# Patient Record
Sex: Female | Born: 1990 | Race: White | Hispanic: No | Smoking: Current every day smoker
Health system: Southern US, Community
[De-identification: ages and names within clinical notes are randomized; demographics above are authoritative.]

## PROBLEM LIST (undated history)

## (undated) DIAGNOSIS — M25561 Pain in right knee: Secondary | ICD-10-CM

## (undated) DIAGNOSIS — M79642 Pain in left hand: Secondary | ICD-10-CM

## (undated) DIAGNOSIS — E669 Obesity, unspecified: Secondary | ICD-10-CM

## (undated) DIAGNOSIS — M79641 Pain in right hand: Secondary | ICD-10-CM

## (undated) DIAGNOSIS — F319 Bipolar disorder, unspecified: Secondary | ICD-10-CM

## (undated) DIAGNOSIS — M130 Polyarthritis, unspecified: Secondary | ICD-10-CM

## (undated) DIAGNOSIS — M25511 Pain in right shoulder: Secondary | ICD-10-CM

## (undated) DIAGNOSIS — M545 Low back pain, unspecified: Secondary | ICD-10-CM

## (undated) DIAGNOSIS — F909 Attention-deficit hyperactivity disorder, unspecified type: Secondary | ICD-10-CM

## (undated) DIAGNOSIS — E785 Hyperlipidemia, unspecified: Secondary | ICD-10-CM

## (undated) DIAGNOSIS — D649 Anemia, unspecified: Secondary | ICD-10-CM

## (undated) DIAGNOSIS — M25562 Pain in left knee: Secondary | ICD-10-CM

## (undated) DIAGNOSIS — Q796 Ehlers-Danlos syndrome, unspecified: Secondary | ICD-10-CM

## (undated) DIAGNOSIS — R7989 Other specified abnormal findings of blood chemistry: Secondary | ICD-10-CM

## (undated) DIAGNOSIS — R5383 Other fatigue: Secondary | ICD-10-CM

## (undated) HISTORY — DX: Other specified abnormal findings of blood chemistry: R79.89

## (undated) HISTORY — DX: Low back pain: M54.5

## (undated) HISTORY — DX: Anemia, unspecified: D64.9

## (undated) HISTORY — DX: Other fatigue: R53.83

## (undated) HISTORY — DX: Pain in right hand: M79.641

## (undated) HISTORY — DX: Low back pain, unspecified: M54.50

## (undated) HISTORY — DX: Attention-deficit hyperactivity disorder, unspecified type: F90.9

## (undated) HISTORY — DX: Pain in left knee: M25.562

## (undated) HISTORY — DX: Pain in right shoulder: M25.511

## (undated) HISTORY — DX: Hyperlipidemia, unspecified: E78.5

## (undated) HISTORY — DX: Ehlers-Danlos syndrome, unspecified: Q79.60

## (undated) HISTORY — DX: Pain in left hand: M79.642

## (undated) HISTORY — DX: Obesity, unspecified: E66.9

## (undated) HISTORY — DX: Pain in right knee: M25.561

## (undated) HISTORY — DX: Polyarthritis, unspecified: M13.0

## (undated) HISTORY — DX: Bipolar disorder, unspecified: F31.9

## (undated) HISTORY — PX: CHOLECYSTECTOMY: SHX55

---

## 2017-01-10 DIAGNOSIS — R072 Precordial pain: Secondary | ICD-10-CM | POA: Insufficient documentation

## 2017-01-11 ENCOUNTER — Emergency Department (HOSPITAL_COMMUNITY): Payer: BLUE CROSS/BLUE SHIELD

## 2017-01-11 ENCOUNTER — Encounter (HOSPITAL_COMMUNITY): Payer: Self-pay | Admitting: Emergency Medicine

## 2017-01-11 ENCOUNTER — Emergency Department (HOSPITAL_COMMUNITY)
Admission: EM | Admit: 2017-01-11 | Discharge: 2017-01-11 | Disposition: A | Discharge status: Other Acute Inpatient Hospital | Payer: BLUE CROSS/BLUE SHIELD | Attending: Emergency Medicine | Admitting: Emergency Medicine

## 2017-01-11 DIAGNOSIS — R072 Precordial pain: Secondary | ICD-10-CM

## 2017-01-11 LAB — I-STAT TROPONIN, ED: Troponin i, poc: 0 ng/mL (ref 0.00–0.08)

## 2017-01-11 LAB — BASIC METABOLIC PANEL
Anion gap: 7 (ref 5–15)
BUN: 15 mg/dL (ref 6–20)
CO2: 25 mmol/L (ref 22–32)
CREATININE: 0.73 mg/dL (ref 0.44–1.00)
Calcium: 8.6 mg/dL — ABNORMAL LOW (ref 8.9–10.3)
Chloride: 104 mmol/L (ref 101–111)
GFR calc Af Amer: >60 mL/min (ref >60)
GLUCOSE: 94 mg/dL (ref 65–99)
Potassium: 4.1 mmol/L (ref 3.5–5.1)
Sodium: 136 mmol/L (ref 135–145)

## 2017-01-11 LAB — CBC
HEMATOCRIT: 33.6 % — AB (ref 36.0–46.0)
HEMOGLOBIN: 10.6 g/dL — AB (ref 12.0–15.0)
MCH: 28.4 pg (ref 26.0–34.0)
MCHC: 31.5 g/dL (ref 30.0–36.0)
MCV: 90.1 fL (ref 78.0–100.0)
PLATELETS: 343 10*3/uL (ref 150–400)
RBC: 3.73 MIL/uL — AB (ref 3.87–5.11)
RDW: 14.5 % (ref 11.5–15.5)
WBC: 9.3 10*3/uL (ref 4.0–10.5)

## 2017-01-11 MED ORDER — HYDROCODONE-ACETAMINOPHEN 5-325 MG PO TABS
1.0000 | ORAL_TABLET | Freq: Once | ORAL | Status: AC
  Administered 2017-01-11: 1 via ORAL
  Filled 2017-01-11: qty 1

## 2017-01-11 MED ORDER — TRAMADOL HCL 50 MG PO TABS
50.0000 mg | ORAL_TABLET | Freq: Once | ORAL | Status: AC
  Administered 2017-01-11: 50 mg via ORAL
  Filled 2017-01-11: qty 1

## 2017-01-11 MED ORDER — IOPAMIDOL (ISOVUE-370) INJECTION 76%
INTRAVENOUS | Status: AC
  Administered 2017-01-11: 100 mL
  Filled 2017-01-11: qty 100

## 2017-01-11 MED ORDER — MORPHINE SULFATE (PF) 4 MG/ML IV SOLN
4.0000 mg | Freq: Once | INTRAVENOUS | Status: AC
  Administered 2017-01-11: 4 mg via INTRAVENOUS

## 2017-01-11 MED ORDER — MORPHINE SULFATE (PF) 4 MG/ML IV SOLN
4.0000 mg | Freq: Once | INTRAVENOUS | Status: DC
  Filled 2017-01-11: qty 1

## 2017-01-11 NOTE — ED Notes (Signed)
[facility] called for transport. 

## 2017-01-11 NOTE — ED Notes (Signed)
Pt returned from CT °

## 2017-01-11 NOTE — ED Triage Notes (Signed)
Reports having a vaginal delivery on Tuesday 27th.  Bleeding stopped today then started having heavy vaginal bleeding this evening with lot of clots.  Also reports left sided chest pain that started this morning that radiates into back.  Taking percocet 5 and Ibuprofen 800mg .  Last dose of percocet at 9pm.  No change in chest pain with meds.

## 2017-01-11 NOTE — ED Notes (Signed)
Pt reports she is bleeding and is passing a large amount of clots. Pt reports giving birth on 3/27. Pt reports passing 2 large golf ball sized clots. Pt reports the chest pain comes about when she breathes and started approximately at 0500 on 01/10/17. Pt reports this is just happening on the left side. Pt reports giving birth at [facility] and went back on early Saturday morning, 01/09/17.

## 2017-01-11 NOTE — ED Notes (Signed)
Called the OB Doc on call for Dr. Bethann Goo w/ [facility], and no answer.

## 2017-01-11 NOTE — ED Notes (Signed)
Patient transported to CT 

## 2017-01-11 NOTE — ED Provider Notes (Signed)
Emergency Department Provider Note   I have reviewed the triage vital signs and the nursing notes.   HISTORY  Chief Complaint Chest Pain   HPI Margaret Ali is a 26 y.o. female with history of prior cholecystectomy who presents to the ED for evaluation of vaginal bleeding and chest pain. This patient is 6 days postpartum following an uncomplicated vaginal delivery. Since that time she has continued to experience bleeding; today passing several large clots. She was seen at an outside facility in [facility] 2 days ago for L leg swelling; she had a negative ultrasound and was later discharged. Approximately 18 hours ago, she woke with chest pain under the L breast which becomes severe and radiates into the L upper back when "breathing deeply". She is continuing to report suprapubic, pelvic pain. Her leg swelling has improved. No dyspnea.    History reviewed. No pertinent past medical history.  There are no active problems to display for this patient.   Past Surgical History:  Procedure Laterality Date  . CHOLECYSTECTOMY      Current Outpatient Rx  . Order #: [id number] Class: Historical Med  . Order #: [id number] Class: Historical Med    Allergies Omnicef [cefdinir]; Augmentin [amoxicillin-pot clavulanate]; and Ciprofloxacin hcl  No family history on file.  Social History Social History  Substance Use Topics  . Smoking status: Never Smoker  . Smokeless tobacco: Never Used  . Alcohol use No    Review of Systems Constitutional: No fever/chills Eyes: No visual changes. ENT: No sore throat. Cardiovascular: + chest pain, L leg swelling per HPI Respiratory: Denies shortness of breath. Gastrointestinal: + lower abdominal/pelvic pain.  No nausea, no vomiting.  No diarrhea.  No constipation. Genitourinary: Negative for dysuria. + vaginal bleeding Musculoskeletal: Negative for back pain. Skin: Negative for rash. Neurological: Negative for headaches, focal weakness or  numbness.  10-point ROS otherwise negative.  ____________________________________________   PHYSICAL EXAM:  VITAL SIGNS: ED Triage Vitals  Enc Vitals Group     BP 01/10/17 2348 (!) 147/83     Pulse Rate 01/10/17 2348 72     Resp 01/10/17 2348 14     Temp 01/10/17 2348 98.7 F (37.1 C)     Temp Source 01/10/17 2348 Oral     SpO2 01/10/17 2348 100 %     Weight 01/10/17 2348 200 lb (90.7 kg)     Height 01/10/17 2348 5\' 4"  (1.626 m)     Pain Score 01/11/17 0004 6   Constitutional: Alert and oriented. Well appearing and in no acute distress. Eyes: Conjunctivae are normal. Head: Atraumatic. Nose: No congestion/rhinnorhea. Mouth/Throat: Mucous membranes are moist.   Neck: No stridor.   Cardiovascular: Normal rate, regular rhythm. Good peripheral circulation. Grossly normal heart sounds.   Respiratory: Normal respiratory effort.  No retractions. Lungs CTAB. Gastrointestinal: Soft and nontender. No distention.  Genitourinary: Moderate vaginal bleeding with clots apparent. No large vaginal laceration or site of active bleeding identified.  Musculoskeletal: No lower extremity tenderness nor edema. No gross deformities of extremities. Neurologic:  Normal speech and language. No gross focal neurologic deficits are appreciated.  Skin:  Skin is warm, dry and intact. No rash noted. Psychiatric: Mood and affect are normal. Speech and behavior are normal.  ____________________________________________   LABS (all labs ordered are listed, but only abnormal results are displayed)  Labs Reviewed  BASIC METABOLIC PANEL - Abnormal; Notable for the following:       Result Value   Calcium 8.6 (*)    All  other components within normal limits  CBC - Abnormal; Notable for the following:    RBC 3.73 (*)    Hemoglobin 10.6 (*)    HCT 33.6 (*)    All other components within normal limits  I-STAT TROPOININ, ED   ____________________________________________  EKG   EKG  Interpretation  Date/Time:  Monday January 11 2017 00:01:42 EDT Ventricular Rate:  77 PR Interval:  136 QRS Duration: 78 QT Interval:  356 QTC Calculation: 402 R Axis:   87 Text Interpretation:  Normal sinus rhythm with sinus arrhythmia Septal infarct , age undetermined Abnormal ECG No STEMI. No old tracing for comparison.  Confirmed by Ernesto Lashway MD, Favian Kittleson [phone]) on 01/11/2017 2:46:57 PM       ____________________________________________  RADIOLOGY  Dg Chest 2 View  Result Date: 01/11/2017 CLINICAL DATA:  Left-sided chest pain radiating to the back. Leg swelling last week. Recent pregnancy. EXAM: CHEST  2 VIEW COMPARISON:  None. FINDINGS: The lungs are clear. The pulmonary vasculature is normal. Heart size is normal. Hilar and mediastinal contours are unremarkable. There is no pleural effusion. IMPRESSION: No active cardiopulmonary disease. Electronically Signed   By: Andreas Newport M.D.   On: 01/11/2017 00:34   Ct Angio Chest Pe W And/or Wo Contrast  Result Date: 01/11/2017 CLINICAL DATA:  Heavy vaginal bleeding. Left-sided chest pain, onset this morning. EXAM: CT ANGIOGRAPHY CHEST WITH CONTRAST TECHNIQUE: Multidetector CT imaging of the chest was performed using the standard protocol during bolus administration of intravenous contrast. Multiplanar CT image reconstructions and MIPs were obtained to evaluate the vascular anatomy. CONTRAST:  100 mL Isovue 370 COMPARISON:  Radiographs 01/11/2017 FINDINGS: Cardiovascular: Satisfactory opacification of the pulmonary arteries to the segmental level. No evidence of pulmonary embolism. Normal heart size. No pericardial effusion. Mediastinum/Nodes: No enlarged mediastinal, hilar, or axillary lymph nodes. Thyroid gland, trachea, and esophagus demonstrate no significant findings. Lungs/Pleura: Mild lung base opacities, left greater than right, likely atelectatic. No confluent airspace consolidation. Upper Abdomen: No acute abnormality. Musculoskeletal: No  chest wall abnormality. No acute or significant osseous findings. Review of the MIP images confirms the above findings. IMPRESSION: Negative for acute pulmonary embolism. Mild atelectatic appearing lung base opacities. No consolidation or effusion. Electronically Signed   By: Andreas Newport M.D.   On: 01/11/2017 02:02   US Pelvis Complete  Result Date: 01/11/2017 CLINICAL DATA:  Six days postpartum vaginal delivery. Passing clots today. Pelvic pain. EXAM: TRANSABDOMINAL ULTRASOUND OF PELVIS TECHNIQUE: Transabdominal ultrasound examination of the pelvis was performed including evaluation of the uterus, ovaries, adnexal regions, and pelvic cul-de-sac. COMPARISON:  None. FINDINGS: Uterus Measurements: 18.3 x 9.9 cm. No fibroids or other mass visualized. Endometrium Thickness: Irregular in thickness and echotexture. Solid-appearing thickening, as well as blood or fluid in the canal. 4.9 cm thickness. Right ovary Measurements: 3.4 x 3.4 x 3.6 cm. Normal appearance/no adnexal mass. Left ovary Measurements: 4.2 x 3.5 x 2.8 cm. Normal appearance/no adnexal mass. Other findings:  No abnormal free fluid. IMPRESSION: 4.9 cm soft tissue thickening in the endometrium, as well as blood or fluid in the canal. Suspicious for retained products of conception. Electronically Signed   By: Andreas Newport M.D.   On: 01/11/2017 04:53    ____________________________________________   PROCEDURES  Procedure(s) performed:   Procedures  None ____________________________________________   INITIAL IMPRESSION / ASSESSMENT AND PLAN / ED COURSE  Pertinent labs & imaging results that were available during my care of the patient were reviewed by me and considered in my medical decision making (  see chart for details).  Patient resents emergency pertinent for evaluation of heavy vaginal bleeding in the setting of recent vaginal delivery. No complications during delivery. Patient is also complaining of some left-sided  pleuritic chest pain. Had recent unilateral leg swelling that resolved. Vital signs are otherwise unremarkable. With concerning story for pulmonary embolism and recent risk factor of pregnancy plan for CT scan of the chest. Hemoglobin is normal. Patient describing some heavy vaginal bleeding and clots. Plan for exam and close OB/GYN follow-up.  02:05 AM Patient with worsening chest pain after return from CT. CT with no PE but some atelectasis changes. Plan to treat pain and reassess.   04:11 AM Patient's CP slightly improved. Pelvic with no significant vaginal trauma or laceration to explain increased bleeding. Will obtain TVUS to evaluate for any retained placenta.   Spoke with Dr. Bethann Goo who requests that the patient be transferred for evaluation and likely D&C.   06:35 AM Spoke with ED attending Dr. Louie Bun who accepts the patient in ED-ED transfer for evaluation with Dr. Bethann Goo on arrival. He is the OB on call today and the patient's OB.  ____________________________________________  FINAL CLINICAL IMPRESSION(S) / ED DIAGNOSES  Final diagnoses:  Retained products of conception with hemorrhage  Precordial chest pain     MEDICATIONS GIVEN DURING THIS VISIT:  Medications  iopamidol (ISOVUE-370) 76 % injection (100 mLs  Contrast Given 01/11/17 0140)  morphine 4 MG/ML injection 4 mg (4 mg Intravenous Given 01/11/17 0244)  HYDROcodone-acetaminophen (NORCO/VICODIN) 5-325 MG per tablet 1 tablet (1 tablet Oral Given 01/11/17 0620)  traMADol (ULTRAM) tablet 50 mg (50 mg Oral Given 01/11/17 0620)     NEW OUTPATIENT MEDICATIONS STARTED DURING THIS VISIT:  None   Note:  This document was prepared using Dragon voice recognition software and may include unintentional dictation errors.  Nanda Quinton, MD Emergency Medicine   Margette Fast, MD 01/11/17 [phone]

## 2017-10-22 DIAGNOSIS — D473 Essential (hemorrhagic) thrombocythemia: Secondary | ICD-10-CM | POA: Diagnosis not present

## 2018-01-11 ENCOUNTER — Encounter: Payer: Self-pay | Admitting: Vascular Surgery

## 2018-01-11 ENCOUNTER — Ambulatory Visit (INDEPENDENT_AMBULATORY_CARE_PROVIDER_SITE_OTHER): Payer: BLUE CROSS/BLUE SHIELD | Admitting: Vascular Surgery

## 2018-01-11 VITALS — BP 130/82 | HR 96 | Resp 18 | Ht 64.0 in | Wt 170.4 lb

## 2018-01-11 DIAGNOSIS — I8393 Asymptomatic varicose veins of bilateral lower extremities: Secondary | ICD-10-CM | POA: Diagnosis not present

## 2018-01-11 NOTE — Progress Notes (Signed)
Subjective:     Patient ID: Margaret Ali, female   DOB: [birth year], 27 y.o.   MRN: [id number]  HPI This 27 year old female was referred for possible venous insufficiency of both lower extremities.  The patient recently was having an ultrasound performed of her shoulder and mentioned to the technologist that she had some "varicose veins" so ultrasound exam of 1 of the lower extremities was performed which was interpreted as venous insufficiency.  The patient was told that she had venous insufficiency and needed treatment.  She was referred to this office for further evaluation.  She denies any chronic swelling.  She does have hypersensitivity and heaviness in both lower extremities with which she attributes to chasing a 84-year-old child around.  She has no history of DVT thrombophlebitis stasis ulcers or bleeding.  She does not wear elastic compression stockings.  Past Medical History:  Diagnosis Date  . ADHD   . Anemia   . Bipolar depression ([place])   . Ehlers-Danlos syndrome   . Elevated platelet count   . Fatigue   . Hyperlipidemia    Mixed  . Low back pain   . Obesity   . Pain in left hand   . Pain in left knee   . Pain in right hand   . Pain in right knee   . Pain in right shoulder   . Polyarthropathy     Social History   Tobacco Use  . Smoking status: Current Every Day Smoker    Types: Cigarettes  . Smokeless tobacco: Never Used  Substance Use Topics  . Alcohol use: No    Family History  Problem Relation Age of Onset  . Hypertension Mother   . Mental illness Mother   . Mental illness Father   . Hypertension Father     Allergies  Allergen Reactions  . Omnicef [Cefdinir] Hives  . Augmentin [Amoxicillin-Pot Clavulanate] Rash  . Ciprofloxacin Hcl Rash     Current Outpatient Medications:  .  amphetamine-dextroamphetamine (ADDERALL) 10 MG tablet, Take 10 mg by mouth daily with breakfast., Disp: , Rfl:  .  citalopram (CELEXA) 40 MG tablet, Take 40 mg by mouth  daily., Disp: , Rfl:  .  Ibuprofen-Famotidine (DUEXIS PO), Take by mouth., Disp: , Rfl:  .  ibuprofen (ADVIL,MOTRIN) 800 MG tablet, Take 800 mg by mouth every 8 (eight) hours., Disp: , Rfl:  .  oxyCODONE-acetaminophen (PERCOCET/ROXICET) 5-325 MG tablet, Take 0.5-1 tablets by mouth every 4 (four) hours as needed for severe pain., Disp: , Rfl:   Vitals:   01/11/18 1009  BP: 130/82  Pulse: 96  Resp: 18  SpO2: 98%  Weight: 170 lb 6.4 oz (77.3 kg)  Height: 5\' 4"  (1.626 m)    Body mass index is 29.25 kg/m.         Review of Systems Denies chest pain but does have occasional dyspnea on exertion.  Complains of occasional numbness in both lower extremities below the knees.  Other systems negative on complete review of systems    Objective:   Physical Exam BP 130/82 (BP Location: Left Arm, Patient Position: Sitting, Cuff Size: Normal)   Pulse 96   Resp 18   Ht 5\' 4"  (1.626 m)   Wt 170 lb 6.4 oz (77.3 kg)   SpO2 98%   BMI 29.25 kg/m     Gen.-alert and oriented x3 in no apparent distress HEENT normal for age Lungs no rhonchi or wheezing Cardiovascular regular rhythm no murmurs carotid pulses 3+ palpable no bruits  audible Abdomen soft nontender no palpable masses Musculoskeletal free of  major deformities Skin clear -no rashes Neurologic normal Lower extremities 3+ femoral and dorsalis pedis pulses palpable bilaterally with no edema A few very small spider veins noted in the medial and lateral thigh areas bilaterally.  There is no hyperpigmentation, ulceration, bulging varicosities, reticular veins, or any other findings consistent with chronic venous insufficiency.  Today I performed a bedside SonoSite ultrasound exam in both great saphenous veins are normal to small in caliber with no reflux.       Assessment:     Spider veins both lower extremities-asymptomatic with no evidence of chronic venous insufficiency    Plan:     No further arterial or venous workup  indicated at this time-patient reassured that she does not have venous insufficiency and that her symptoms of numbness and heaviness are not related to her vasculature

## 2018-06-10 IMAGING — CT CT ANGIO CHEST
1 of 9 series · 12 of 36 positions shown · IV contrast (Iohexol (Omnipaque 350))
Comparison: Radiographs 01/11/2017

CLINICAL DATA: Heavy vaginal bleeding. Left-sided chest pain, onset
this morning.

EXAM:
CT ANGIOGRAPHY CHEST WITH CONTRAST
TECHNIQUE: Multidetector CT imaging of the chest was performed using the
standard protocol during bolus administration of intravenous
contrast. Multiplanar CT image reconstructions and MIPs were
obtained to evaluate the vascular anatomy.
CONTRAST:  100 mL Isovue 370

[Series 406: thins pacs · axial · 0.65mm/px · z∈[+813,+1030]mm · 12 of 257 slices shown]
[im 20/257  lung]
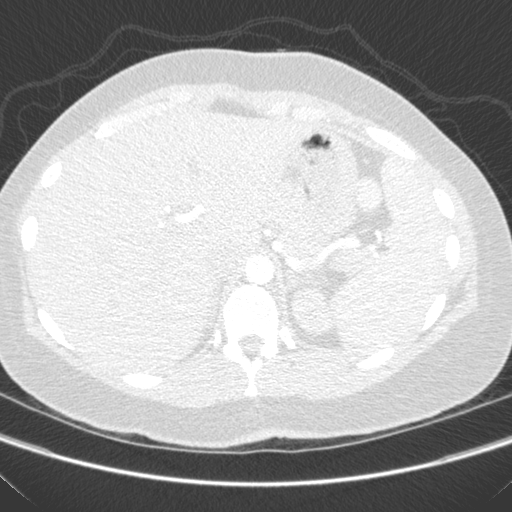
[im 40/257  mediastinal]
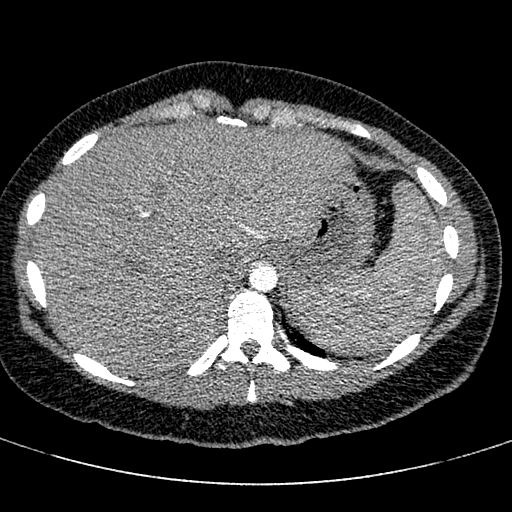
[im 60/257  lung]
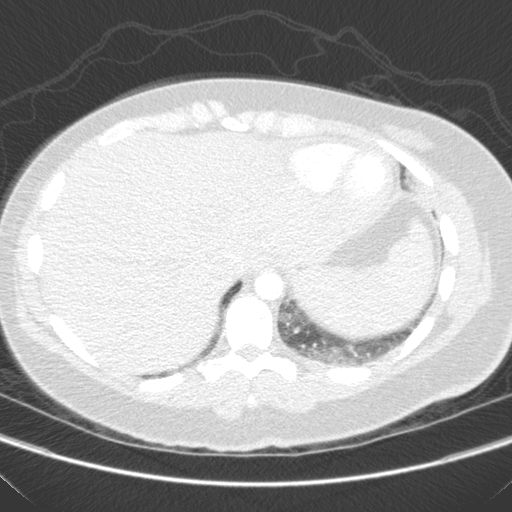
[im 79/257  mediastinal]
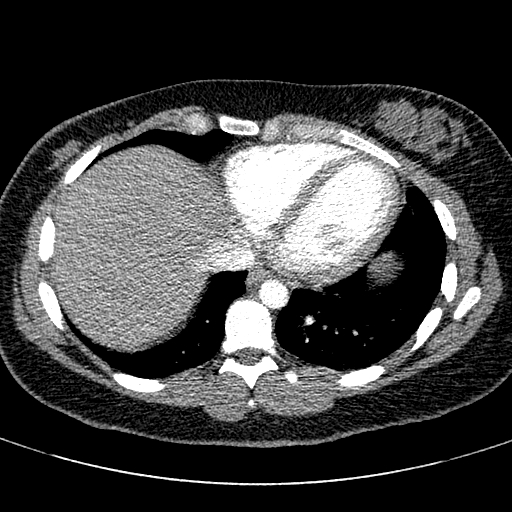
[im 99/257  lung]
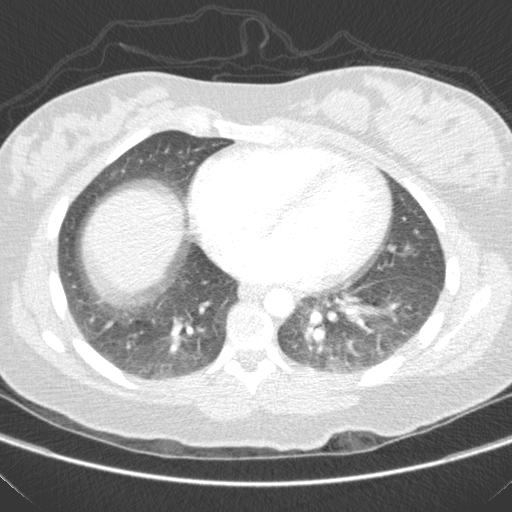
[im 119/257  mediastinal]
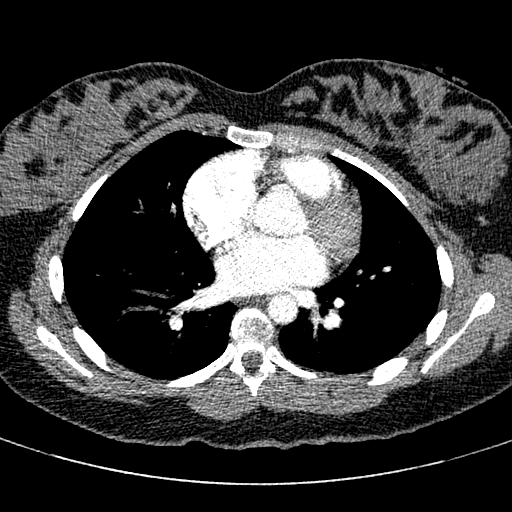
[im 138/257  lung]
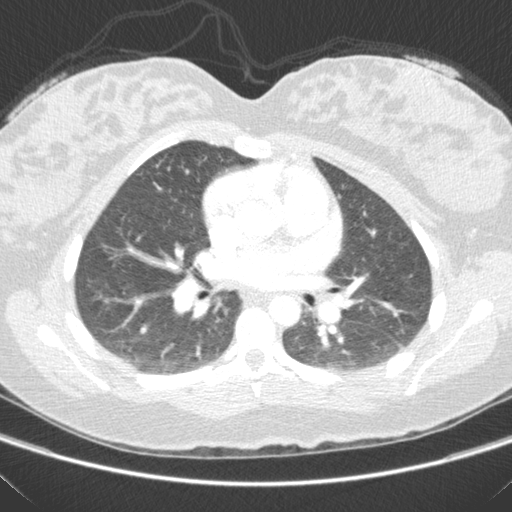
[im 158/257  mediastinal]
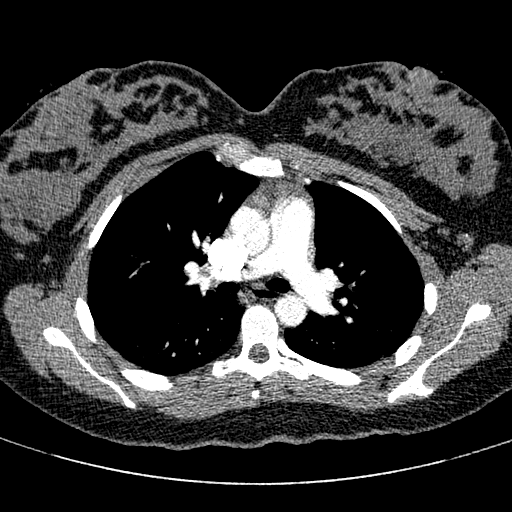
[im 178/257  lung]
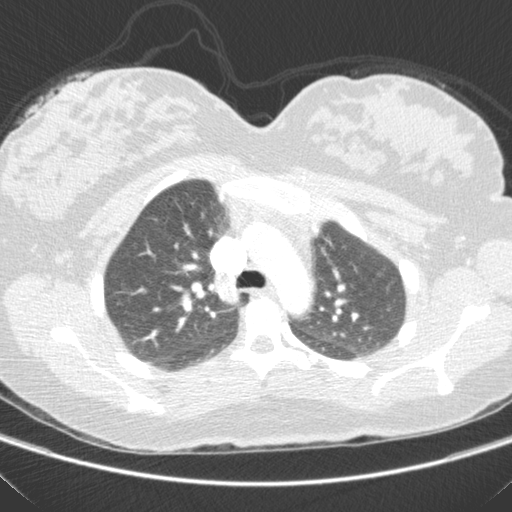
[im 197/257  mediastinal]
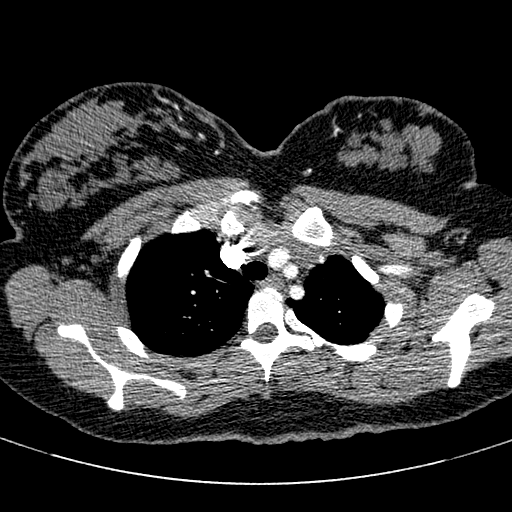
[im 217/257  lung]
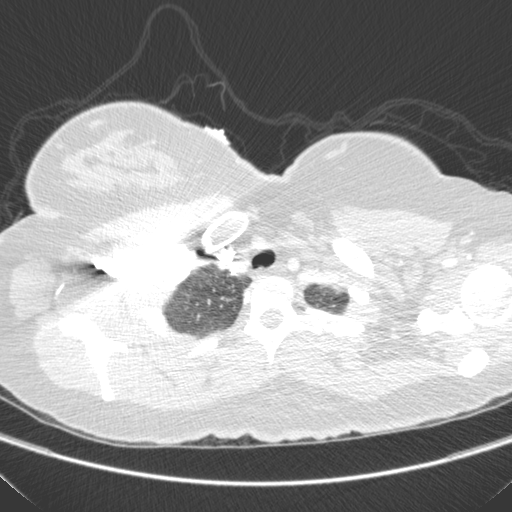
[im 237/257  mediastinal]
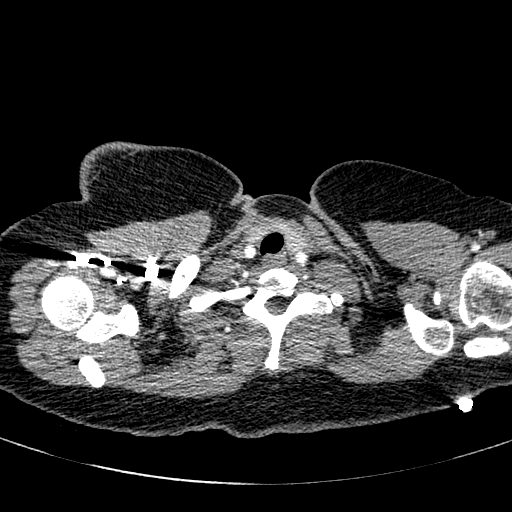

[12 of 36 positions shown; findings below may reference images not displayed]

FINDINGS: Cardiovascular: Satisfactory opacification of the pulmonary arteries
to the segmental level. No evidence of pulmonary embolism. Normal
heart size. No pericardial effusion.

Mediastinum/Nodes: No enlarged mediastinal, hilar, or axillary lymph
nodes. Thyroid gland, trachea, and esophagus demonstrate no
significant findings.

Lungs/Pleura: Mild lung base opacities, left greater than right,
likely atelectatic. No confluent airspace consolidation.

Upper Abdomen: No acute abnormality.

Musculoskeletal: No chest wall abnormality. No acute or significant
osseous findings.

Review of the MIP images confirms the above findings.
IMPRESSION: Negative for acute pulmonary embolism. Mild atelectatic appearing
lung base opacities. No consolidation or effusion.

## 2018-11-19 ENCOUNTER — Emergency Department (HOSPITAL_COMMUNITY)
Admission: EM | Admit: 2018-11-19 | Discharge: 2018-11-20 | Disposition: A | Discharge status: Other Acute Inpatient Hospital | Payer: Medicaid Other | Attending: Emergency Medicine | Admitting: Emergency Medicine

## 2018-11-19 ENCOUNTER — Encounter (HOSPITAL_COMMUNITY): Payer: Self-pay | Admitting: Nurse Practitioner

## 2018-11-19 DIAGNOSIS — F332 Major depressive disorder, recurrent severe without psychotic features: Secondary | ICD-10-CM | POA: Insufficient documentation

## 2018-11-19 DIAGNOSIS — F1910 Other psychoactive substance abuse, uncomplicated: Secondary | ICD-10-CM | POA: Diagnosis present

## 2018-11-19 DIAGNOSIS — Z046 Encounter for general psychiatric examination, requested by authority: Secondary | ICD-10-CM

## 2018-11-19 DIAGNOSIS — F3112 Bipolar disorder, current episode manic without psychotic features, moderate: Secondary | ICD-10-CM | POA: Diagnosis present

## 2018-11-19 DIAGNOSIS — R45851 Suicidal ideations: Secondary | ICD-10-CM | POA: Insufficient documentation

## 2018-11-19 DIAGNOSIS — F319 Bipolar disorder, unspecified: Secondary | ICD-10-CM

## 2018-11-19 DIAGNOSIS — Z79899 Other long term (current) drug therapy: Secondary | ICD-10-CM | POA: Insufficient documentation

## 2018-11-19 DIAGNOSIS — F17210 Nicotine dependence, cigarettes, uncomplicated: Secondary | ICD-10-CM | POA: Insufficient documentation

## 2018-11-19 LAB — COMPREHENSIVE METABOLIC PANEL
ALBUMIN: 4.1 g/dL (ref 3.5–5.0)
ALK PHOS: 88 U/L (ref 38–126)
ALT: 22 U/L (ref 0–44)
AST: 22 U/L (ref 15–41)
Anion gap: 7 (ref 5–15)
BUN: 12 mg/dL (ref 6–20)
CO2: 30 mmol/L (ref 22–32)
Calcium: 9.1 mg/dL (ref 8.9–10.3)
Chloride: 101 mmol/L (ref 98–111)
Creatinine, Ser: 0.7 mg/dL (ref 0.44–1.00)
GFR calc Af Amer: >60 mL/min (ref >60)
GFR calc non Af Amer: >60 mL/min (ref >60)
Glucose, Bld: 103 mg/dL — ABNORMAL HIGH (ref 70–99)
Potassium: 3.7 mmol/L (ref 3.5–5.1)
Sodium: 138 mmol/L (ref 135–145)
Total Bilirubin: 0.5 mg/dL (ref 0.3–1.2)
Total Protein: 7.6 g/dL (ref 6.5–8.1)

## 2018-11-19 LAB — RAPID URINE DRUG SCREEN, HOSP PERFORMED
Amphetamines: POSITIVE — AB
BARBITURATES: NOT DETECTED
Benzodiazepines: POSITIVE — AB
Cocaine: POSITIVE — AB
Opiates: NOT DETECTED
Tetrahydrocannabinol: POSITIVE — AB

## 2018-11-19 LAB — CBC WITH DIFFERENTIAL/PLATELET
Abs Immature Granulocytes: 0.04 10*3/uL (ref 0.00–0.07)
BASOS PCT: 1 %
Basophils Absolute: 0.1 10*3/uL (ref 0.0–0.1)
Eosinophils Absolute: 0.1 10*3/uL (ref 0.0–0.5)
Eosinophils Relative: 1 %
HCT: 41.9 % (ref 36.0–46.0)
Hemoglobin: 13.3 g/dL (ref 12.0–15.0)
Immature Granulocytes: 1 %
Lymphocytes Relative: 18 %
Lymphs Abs: 1.5 10*3/uL (ref 0.7–4.0)
MCH: 31.1 pg (ref 26.0–34.0)
MCHC: 31.7 g/dL (ref 30.0–36.0)
MCV: 98.1 fL (ref 80.0–100.0)
Monocytes Absolute: 0.6 10*3/uL (ref 0.1–1.0)
Monocytes Relative: 7 %
Neutro Abs: 6.1 10*3/uL (ref 1.7–7.7)
Neutrophils Relative %: 72 %
Platelets: 348 10*3/uL (ref 150–400)
RBC: 4.27 MIL/uL (ref 3.87–5.11)
RDW: 13.2 % (ref 11.5–15.5)
WBC: 8.4 10*3/uL (ref 4.0–10.5)
nRBC: 0 % (ref 0.0–0.2)

## 2018-11-19 LAB — ETHANOL: Alcohol, Ethyl (B): <10 mg/dL (ref <10)

## 2018-11-19 LAB — LITHIUM LEVEL: Lithium Lvl: 0.16 mmol/L — ABNORMAL LOW (ref 0.60–1.20)

## 2018-11-19 MED ORDER — ALPRAZOLAM 1 MG PO TABS
1.0000 mg | ORAL_TABLET | Freq: Two times a day (BID) | ORAL | Status: DC | PRN
  Administered 2018-11-19: 1 mg via ORAL
  Filled 2018-11-19: qty 1

## 2018-11-19 MED ORDER — CITALOPRAM HYDROBROMIDE 10 MG PO TABS
40.0000 mg | ORAL_TABLET | Freq: Every day | ORAL | Status: DC
  Administered 2018-11-20: 40 mg via ORAL
  Filled 2018-11-19 (×2): qty 4

## 2018-11-19 MED ORDER — BREXPIPRAZOLE 1 MG PO TABS
1.0000 mg | ORAL_TABLET | Freq: Every day | ORAL | Status: DC
  Administered 2018-11-19 – 2018-11-20 (×2): 1 mg via ORAL
  Filled 2018-11-19 (×3): qty 1

## 2018-11-19 MED ORDER — AMPHETAMINE-DEXTROAMPHETAMINE 20 MG PO TABS
20.0000 mg | ORAL_TABLET | Freq: Two times a day (BID) | ORAL | Status: DC
  Filled 2018-11-19: qty 1

## 2018-11-19 MED ORDER — OXYCODONE HCL 5 MG PO TABS
5.0000 mg | ORAL_TABLET | Freq: Two times a day (BID) | ORAL | Status: DC
  Administered 2018-11-19: 5 mg via ORAL
  Filled 2018-11-19: qty 1

## 2018-11-19 MED ORDER — OXYCODONE-ACETAMINOPHEN 10-325 MG PO TABS: 1.0000 | ORAL_TABLET | Freq: Two times a day (BID) | ORAL | Status: DC

## 2018-11-19 MED ORDER — GABAPENTIN 300 MG PO CAPS
300.0000 mg | ORAL_CAPSULE | Freq: Two times a day (BID) | ORAL | Status: DC
  Administered 2018-11-19 – 2018-11-20 (×2): 300 mg via ORAL
  Filled 2018-11-19 (×2): qty 1

## 2018-11-19 MED ORDER — LITHIUM CARBONATE ER 300 MG PO TBCR
300.0000 mg | EXTENDED_RELEASE_TABLET | Freq: Every day | ORAL | Status: DC
  Administered 2018-11-19: 300 mg via ORAL
  Filled 2018-11-19: qty 1

## 2018-11-19 MED ORDER — OXYCODONE-ACETAMINOPHEN 5-325 MG PO TABS
1.0000 | ORAL_TABLET | Freq: Two times a day (BID) | ORAL | Status: DC
  Administered 2018-11-19: 1 via ORAL
  Filled 2018-11-19: qty 1

## 2018-11-19 NOTE — [facility] Assessment (Signed)
[facility] Assessment Progress Note Case was staffed with Lord DNP who recommended a inpatient admission to assist with stabilization.       

## 2018-11-19 NOTE — BH Assessment (Addendum)
Assessment Note  Margaret Ali is an 28 y.o. female that presents this date with IVC. Per IVC: "Respondent has been diagnosed with depression and prescribed multiple medications that she is currently abusing. The respondent sent several texts to her partner that she was going to slit her wrists and wants to die. Respondent states she will not make it to the end of 2020. Respondent states she wants to die to end the pain. Respondent is a danger to herself." Patient denies content of IVC although reports active S/I. Patient is vague in reference to intent or plan. When questioned in reference to statements contained in IVC patient did report she sent texts to her partner mentioning self harm although states that was "some time ago she thinks." Patient renders conflicting history and is a poor historian. Patient cannot recall time frame or give details in reference to texts sent. Patient reports one prior attempt at self harm that per chart review, was in 2018 when she was admitted to [facility] for S/I. Patient will not elaborate on that incident. Patient states she was diagnosed with MDD at age 37 and has been on medications since then seeing multiple providers to assist with medication management. Patient states she has been receiving OP services from [facility] in [place] [name] MD) for the last two years that assists with medication management. Patient reports current medication compliance although again renders conflicting history in reference to current medications prescribed. 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 Patient denies any SA history with UDS pending. Patient reports she is currently residing with her female partner of five years and had a verbal altercation this date over child care issues. Patient reports she is currently  single although her and partner share custody over one child. Patient states "that is what all this is about" reporting her partner "got papers" on her to prevent her seeing said child. Patient is oriented x 4 and is observed to be very liable. Patient is crying and is observed to be very agitated as she renders history. Patient denies any H/I or AVH. Per notes patient presented by the [employer] with IVC paper work taken out by  boyfriend/father of her child who alleges in the plaintiffs section that patient made self injuring claims and "she wished not to make it to the end of 2020." Patient has had a previous inpatient IVC at [facility]. She denies being suicidal or wanting to harm herself. Case was staffed with Reita Cliche DNP who recommended a inpatient admission to assist with stabilization. .  Diagnosis: F33.2 MDD recurrent without psychotic features, severe  Past Medical History:  Past Medical History:  Diagnosis Date  . ADHD   . Anemia   . Bipolar depression ([place])   . Ehlers-Danlos syndrome   . Elevated platelet count   . Fatigue   . Hyperlipidemia    Mixed  . Low back pain   . Obesity   . Pain in left hand   . Pain in left knee   . Pain in right hand   . Pain in right knee   . Pain in right shoulder   . Polyarthropathy     Past Surgical History:  Procedure Laterality Date  . CHOLECYSTECTOMY      Family History:  Family History  Problem Relation Age of Onset  . Hypertension Mother   .  Mental illness Mother   . Mental illness Father   . Hypertension Father     Social History:  reports that she has been smoking cigarettes. She has never used smokeless tobacco. She reports that she does not drink alcohol. No history on file for drug.  Additional Social History:  Alcohol / Drug Use Pain Medications: See MAR Prescriptions: See MAR Over the Counter: See MAR History of alcohol / drug use?: No history of alcohol / drug abuse Longest period of sobriety (when/how long):  NA Negative Consequences of Use: (NA) Withdrawal Symptoms: (NA)  CIWA: CIWA-Ar BP: (!) 144/93 Pulse Rate: (!) 103 COWS:    Allergies:  Allergies  Allergen Reactions  . Omnicef [Cefdinir] Hives  . Ciprofloxacin Hcl Rash    Home Medications: (Not in a hospital admission)   OB/GYN Status:  No LMP recorded.  General Assessment Data [facility] of [facility]: [facility] TTS Assessment: In system Is this a Tele or Face-to-Face Assessment?: Face-to-Face Is this an Initial Assessment or a Re-assessment for this encounter?: Initial Assessment Patient Accompanied by:: N/A Language Other than English: No Living Arrangements: Other (Comment)(Partner) What gender do you identify as?: Female Marital status: Long term relationship Maiden name: Latulippe Pregnancy Status: Unknown Living Arrangements: Spouse/significant other Can pt return to current living arrangement?: Yes Admission Status: Involuntary Petitioner: Other Is patient capable of signing voluntary admission?: Yes Referral Source: Self/Family/Friend Insurance type: Medicaid     Crisis Care Plan Living Arrangements: Spouse/significant other Legal Guardian: (NA) Name of Psychiatrist: [facility]) Name of Therapist: None  Education Status Is patient currently in school?: No Is the patient employed, unemployed or receiving disability?: Unemployed  Risk to self with the past 6 months Suicidal Ideation: Yes-Currently Present Has patient been a risk to self within the past 6 months prior to admission? : No Suicidal Intent: No Has patient had any suicidal intent within the past 6 months prior to admission? : No Is patient at risk for suicide?: Yes Suicidal Plan?: No Has patient had any suicidal plan within the past 6 months prior to admission? : No Access to Means: No What has been your use of drugs/alcohol within the last 12 months?: Denies Previous Attempts/Gestures: Yes How many times?: 1 Other Self Harm  Risks: NA Triggers for Past Attempts: Unknown Intentional Self Injurious Behavior: None Family Suicide History: No Recent stressful life event(s): Conflict (Comment)(Conflict with partner) Persecutory voices/beliefs?: No Depression: No Depression Symptoms: (Denies) Substance abuse history and/or treatment for substance abuse?: No Suicide prevention information given to non-admitted patients: Not applicable  Risk to Others within the past 6 months Homicidal Ideation: No Does patient have any lifetime risk of violence toward others beyond the six months prior to admission? : No Thoughts of Harm to Others: No Current Homicidal Intent: No Current Homicidal Plan: No Access to Homicidal Means: No Identified Victim: NA History of harm to others?: No Assessment of Violence: None Noted Violent Behavior Description: NA Does patient have access to weapons?: No Criminal Charges Pending?: No Does patient have a court date: No Is patient on probation?: No  Psychosis Hallucinations: None noted Delusions: None noted  Mental Status Report Appearance/Hygiene: Unremarkable Eye Contact: Fair Motor Activity: Freedom of movement Speech: Logical/coherent Level of Consciousness: Crying Mood: Anxious Affect: Labile Anxiety Level: Moderate Thought Processes: Coherent, Relevant Judgement: Partial Orientation: Person, Place, Time Obsessive Compulsive Thoughts/Behaviors: None  Cognitive Functioning Concentration: Normal Memory: Recent Intact, Remote Intact Is patient IDD: No Insight: Fair Impulse Control: Poor Appetite: Good Have you  had any weight changes? : No Change Sleep: No Change Total Hours of Sleep: 6 Vegetative Symptoms: None  ADLScreening [facility] Assessment Services) Patient's cognitive ability adequate to safely complete daily activities?: Yes Patient able to express need for assistance with ADLs?: Yes Independently performs ADLs?: Yes (appropriate for developmental  age)  Prior Inpatient Therapy Prior Inpatient Therapy: Yes Prior Therapy Dates: 2018 Prior Therapy Facilty/Provider(s): [facility] Reason for Treatment: MH issues  Prior Outpatient Therapy Prior Outpatient Therapy: Yes Prior Therapy Dates: Ongoing Prior Therapy Facilty/Provider(s): [facility] Reason for Treatment: Med mang Does patient have an ACCT team?: No Does patient have Intensive In-House Services?  : No Does patient have [facility] services? : No Does patient have P4CC services?: No  ADL Screening (condition at time of admission) Patient's cognitive ability adequate to safely complete daily activities?: Yes Is the patient deaf or have difficulty hearing?: No Does the patient have difficulty seeing, even when wearing glasses/contacts?: No Does the patient have difficulty concentrating, remembering, or making decisions?: No Patient able to express need for assistance with ADLs?: Yes Does the patient have difficulty dressing or bathing?: No Independently performs ADLs?: Yes (appropriate for developmental age) Does the patient have difficulty walking or climbing stairs?: No Weakness of Legs: None Weakness of Arms/Hands: None  Home Assistive Devices/Equipment Home Assistive Devices/Equipment: None  Therapy Consults (therapy consults require a physician order) PT Evaluation Needed: No OT Evalulation Needed: No SLP Evaluation Needed: No Abuse/Neglect Assessment (Assessment to be complete while patient is alone) Physical Abuse: Denies Verbal Abuse: Denies Sexual Abuse: Denies Exploitation of patient/patient's resources: Denies Self-Neglect: Denies Values / Beliefs Cultural Requests During Hospitalization: None Spiritual Requests During Hospitalization: None Consults Spiritual Care Consult Needed: No Social Work Consult Needed: No Regulatory affairs officer (For Healthcare) Does Patient Have a Medical Advance Directive?: No Would patient like information on creating a medical  advance directive?: No - Patient declined          Disposition: Case was staffed with Reita Cliche DNP who recommended a inpatient admission to assist with stabilization. .  Disposition Initial Assessment Completed for this Encounter: Yes Disposition of Patient: Admit Type of inpatient treatment program: Adult Patient refused recommended treatment: No Mode of transportation if patient is discharged/movement?: (Unk)  On Site Evaluation by:   Reviewed with Physician:    Mamie Nick 11/19/2018 4:56 PM

## 2018-11-19 NOTE — ED Triage Notes (Signed)
Pt is presented by the [employer] with IVC paper work taken out by ex boyfriend/father of her child who alleges in the plaintiffs section that pt made self injuring claims and "she wished not to make it to the end of 2020." Pt has had a previous inpatient IVC at [facility]. She denies being suicidal or wanting to harm herself.

## 2018-11-19 NOTE — ED Provider Notes (Signed)
[facility] DEPT Provider Note   CSN: [id number] Arrival date & time: 11/19/18  1504     History   Chief Complaint Chief Complaint  Patient presents with  . IVC'd    HPI Margaret Ali is a 28 y.o. female.  The history is provided by the patient and medical records. No language interpreter was used.   Margaret Ali is a 27 y.o. female who presents to the ER today under IVC filed by ex-boyfriend who is also the father of her child. Per IVC paperwork, patient sent text messages stating that she wanted to slit her wrists and die. She allegedly sent another message that she did not see herself making it to the end of 2020 due to her depression. Patient denies this. She states that she is not suicidal. Denies homicidal thought or A/V hallucinations. She reports that she has been working very hard on her mental health recently and frustrated that someone would take advantage of this. She sees therapist monthly and a physician for her medications. She recently had a new medication added to her regimen and has been seeing her doctor every 2 weeks for adjustments. She has been compliant with her medications and optimistic that her symptoms are improving. She has had two prior inpatient admissions at [place] per patient, but states that both of these times, she felt her emotions getting out of hand and actually voluntarily went to the hospital for help.   Past Medical History:  Diagnosis Date  . ADHD   . Anemia   . Bipolar depression ([place])   . Ehlers-Danlos syndrome   . Elevated platelet count   . Fatigue   . Hyperlipidemia    Mixed  . Low back pain   . Obesity   . Pain in left hand   . Pain in left knee   . Pain in right hand   . Pain in right knee   . Pain in right shoulder   . Polyarthropathy     Patient Active Problem List   Diagnosis Date Noted  . Spider veins of both lower extremities 01/11/2018    Past Surgical History:  Procedure  Laterality Date  . CHOLECYSTECTOMY       OB History   No obstetric history on file.      Home Medications    Prior to Admission medications   Medication Sig Start Date End Date Taking? Authorizing Provider  ALPRAZolam Duanne Moron) 1 MG tablet Take 1 mg by mouth 2 (two) times daily as needed for anxiety. 10/31/18  Yes [provider]  amphetamine-dextroamphetamine (ADDERALL) 20 MG tablet Take 20 mg by mouth 2 (two) times daily.   Yes [provider]  Brexpiprazole (REXULTI) 1 MG TABS Take 1 mg by mouth daily.   Yes [provider]  citalopram (CELEXA) 40 MG tablet Take 40 mg by mouth daily.   Yes [provider]  gabapentin (NEURONTIN) 300 MG capsule Take 300 mg by mouth 2 (two) times daily.   Yes [provider]  lithium carbonate (LITHOBID) 300 MG CR tablet Take 300 mg by mouth at bedtime.   Yes [provider]  oxyCODONE-acetaminophen (PERCOCET) 10-325 MG tablet Take 1 tablet by mouth 2 (two) times daily.   Yes [provider]    Family History Family History  Problem Relation Age of Onset  . Hypertension Mother   . Mental illness Mother   . Mental illness Father   . Hypertension Father  Social History Social History   Tobacco Use  . Smoking status: Current Every Day Smoker    Types: Cigarettes  . Smokeless tobacco: Never Used  Substance Use Topics  . Alcohol use: No  . Drug use: Not on file     Allergies   Omnicef [cefdinir] and Ciprofloxacin hcl   Review of Systems Review of Systems  All other systems reviewed and are negative.    Physical Exam Updated Vital Signs BP (!) 144/93 (BP Location: Right Arm)   Pulse (!) 103   Temp 98.3 F (36.8 C) (Oral)   Resp 20   SpO2 100%   Physical Exam Vitals signs and nursing note reviewed.  Constitutional:      General: She is not in acute distress.    Appearance: She is well-developed.  HENT:     Head: Normocephalic and atraumatic.  Neck:      Musculoskeletal: Neck supple.  Cardiovascular:     Rate and Rhythm: Regular rhythm.     Heart sounds: Normal heart sounds. No murmur.  Pulmonary:     Effort: Pulmonary effort is normal. No respiratory distress.     Breath sounds: Normal breath sounds.  Abdominal:     General: There is no distension.     Palpations: Abdomen is soft.     Tenderness: There is no abdominal tenderness.  Skin:    General: Skin is warm and dry.  Neurological:     Mental Status: She is alert and oriented to person, place, and time.      ED Treatments / Results  Labs (all labs ordered are listed, but only abnormal results are displayed) Labs Reviewed  COMPREHENSIVE METABOLIC PANEL - Abnormal; Notable for the following components:      Result Value   Glucose, Bld 103 (*)    All other components within normal limits  RAPID URINE DRUG SCREEN, HOSP PERFORMED - Abnormal; Notable for the following components:   Cocaine POSITIVE (*)    Benzodiazepines POSITIVE (*)    Amphetamines POSITIVE (*)    Tetrahydrocannabinol POSITIVE (*)    All other components within normal limits  LITHIUM LEVEL - Abnormal; Notable for the following components:   Lithium Lvl 0.16 (*)    All other components within normal limits  CBC WITH DIFFERENTIAL/PLATELET  ETHANOL    EKG None  Radiology No results found.  Procedures Procedures (including critical care time)  Medications Ordered in ED Medications - No data to display   Initial Impression / Assessment and Plan / ED Course  I have reviewed the triage vital signs and the nursing notes.  Pertinent labs & imaging results that were available during my care of the patient were reviewed by me and considered in my medical decision making (see chart for details).    Margaret Ali is a 28 y.o. female who presents to ED under IVC taken out by ex-boyfriend / her child's father for concerns of patient safety. Per IVC paperwork, patient sent text messages that she wanted to  slit her wrists and die, however patient adamantly denies this. She denies SI/HI/AVH. Labs reviewed and reassuring other than her low lithium level.  Medically cleared.  TTS recommends inpatient treatment.   Final Clinical Impressions(s) / ED Diagnoses   Final diagnoses:  Involuntary commitment    ED Discharge Orders    None       Margaret Ali, Ozella Almond, PA-C 11/19/18 1741    Quintella Reichert, MD 11/20/18 [phone]

## 2018-11-19 NOTE — ED Notes (Signed)
Bed: [facility] Expected date:  Expected time:  Means of arrival:  Comments: 

## 2018-11-20 ENCOUNTER — Encounter (HOSPITAL_COMMUNITY): Payer: Self-pay | Admitting: *Deleted

## 2018-11-20 ENCOUNTER — Inpatient Hospital Stay (HOSPITAL_COMMUNITY)
Admission: AD | Admit: 2018-11-20 | Discharge: 2018-11-22 | DRG: 885 | Disposition: A | Discharge status: Home / Self Care | Payer: Medicaid Other | Attending: Psychiatry | Admitting: Psychiatry

## 2018-11-20 ENCOUNTER — Other Ambulatory Visit: Payer: Self-pay

## 2018-11-20 DIAGNOSIS — Z79899 Other long term (current) drug therapy: Secondary | ICD-10-CM | POA: Diagnosis not present

## 2018-11-20 DIAGNOSIS — M130 Polyarthritis, unspecified: Secondary | ICD-10-CM | POA: Diagnosis present

## 2018-11-20 DIAGNOSIS — F909 Attention-deficit hyperactivity disorder, unspecified type: Secondary | ICD-10-CM | POA: Diagnosis present

## 2018-11-20 DIAGNOSIS — Z888 Allergy status to other drugs, medicaments and biological substances status: Secondary | ICD-10-CM | POA: Diagnosis not present

## 2018-11-20 DIAGNOSIS — F3162 Bipolar disorder, current episode mixed, moderate: Secondary | ICD-10-CM | POA: Diagnosis present

## 2018-11-20 DIAGNOSIS — Q796 Ehlers-Danlos syndrome, unspecified: Secondary | ICD-10-CM | POA: Diagnosis not present

## 2018-11-20 DIAGNOSIS — F1210 Cannabis abuse, uncomplicated: Secondary | ICD-10-CM | POA: Diagnosis present

## 2018-11-20 DIAGNOSIS — R45851 Suicidal ideations: Secondary | ICD-10-CM | POA: Diagnosis present

## 2018-11-20 DIAGNOSIS — F319 Bipolar disorder, unspecified: Secondary | ICD-10-CM

## 2018-11-20 DIAGNOSIS — F1910 Other psychoactive substance abuse, uncomplicated: Secondary | ICD-10-CM | POA: Diagnosis present

## 2018-11-20 DIAGNOSIS — Z818 Family history of other mental and behavioral disorders: Secondary | ICD-10-CM | POA: Diagnosis not present

## 2018-11-20 DIAGNOSIS — F3112 Bipolar disorder, current episode manic without psychotic features, moderate: Secondary | ICD-10-CM | POA: Diagnosis present

## 2018-11-20 LAB — PREGNANCY, URINE: PREG TEST UR: NEGATIVE

## 2018-11-20 MED ORDER — CITALOPRAM HYDROBROMIDE 40 MG PO TABS
40.0000 mg | ORAL_TABLET | Freq: Every day | ORAL | Status: DC
  Administered 2018-11-21 – 2018-11-22 (×2): 40 mg via ORAL
  Filled 2018-11-20: qty 1
  Filled 2018-11-20: qty 2
  Filled 2018-11-20 (×2): qty 1

## 2018-11-20 MED ORDER — MAGNESIUM HYDROXIDE 400 MG/5ML PO SUSP: 30.0000 mL | Freq: Every day | ORAL | Status: DC | PRN

## 2018-11-20 MED ORDER — ALUM & MAG HYDROXIDE-SIMETH 200-200-20 MG/5ML PO SUSP: 30.0000 mL | ORAL | Status: DC | PRN

## 2018-11-20 MED ORDER — TRAZODONE HCL 50 MG PO TABS
50.0000 mg | ORAL_TABLET | Freq: Every evening | ORAL | Status: DC | PRN
  Administered 2018-11-20 – 2018-11-21 (×2): 50 mg via ORAL
  Filled 2018-11-20 (×2): qty 1

## 2018-11-20 MED ORDER — NICOTINE 21 MG/24HR TD PT24
21.0000 mg | MEDICATED_PATCH | Freq: Every day | TRANSDERMAL | Status: DC
  Administered 2018-11-21 – 2018-11-22 (×2): 21 mg via TRANSDERMAL
  Filled 2018-11-20 (×4): qty 1

## 2018-11-20 MED ORDER — ACETAMINOPHEN 325 MG PO TABS: 650.0000 mg | ORAL_TABLET | Freq: Four times a day (QID) | ORAL | Status: DC | PRN

## 2018-11-20 MED ORDER — LITHIUM CARBONATE ER 300 MG PO TBCR
300.0000 mg | EXTENDED_RELEASE_TABLET | Freq: Two times a day (BID) | ORAL | Status: DC
  Administered 2018-11-20 – 2018-11-22 (×4): 300 mg via ORAL
  Filled 2018-11-20 (×8): qty 1

## 2018-11-20 MED ORDER — IBUPROFEN 600 MG PO TABS: 600.0000 mg | ORAL_TABLET | Freq: Four times a day (QID) | ORAL | Status: DC | PRN

## 2018-11-20 MED ORDER — LITHIUM CARBONATE ER 300 MG PO TBCR: 300.0000 mg | EXTENDED_RELEASE_TABLET | Freq: Two times a day (BID) | ORAL | Status: DC

## 2018-11-20 MED ORDER — HYDROXYZINE HCL 25 MG PO TABS: 25.0000 mg | ORAL_TABLET | Freq: Four times a day (QID) | ORAL | Status: DC | PRN

## 2018-11-20 MED ORDER — BREXPIPRAZOLE 1 MG PO TABS
1.0000 mg | ORAL_TABLET | Freq: Every day | ORAL | Status: DC
  Administered 2018-11-21: 1 mg via ORAL
  Filled 2018-11-20 (×2): qty 1

## 2018-11-20 MED ORDER — GABAPENTIN 300 MG PO CAPS
300.0000 mg | ORAL_CAPSULE | Freq: Two times a day (BID) | ORAL | Status: DC
  Administered 2018-11-20 – 2018-11-22 (×4): 300 mg via ORAL
  Filled 2018-11-20 (×8): qty 1
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
## 2018-11-20 NOTE — ED Notes (Signed)
Pt sleeping at present time, sitter present

## 2018-11-20 NOTE — ED Notes (Signed)
Pt arrived to unit calm and cooperative.  Pt was oriented to room and unit.  15 minute checks and video monitoring in place.

## 2018-11-20 NOTE — ED Notes (Signed)
Pt discharged safely with [facility].  Pt was calm and cooperative.  All belongings were sent with patient.

## 2018-11-20 NOTE — ED Notes (Signed)
Edie RN updated on pt, pt to go to Room 43.

## 2018-11-20 NOTE — Progress Notes (Signed)
Patient did not attend the evening speaker [place] meeting. Pt was newly admitted to unit and reported needing rest.
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
## 2018-11-20 NOTE — ED Notes (Signed)
Bed: [facility] Expected date:  Expected time:  Means of arrival:  Comments:

## 2018-11-21 DIAGNOSIS — F3112 Bipolar disorder, current episode manic without psychotic features, moderate: Secondary | ICD-10-CM

## 2018-11-21 MED ORDER — BREXPIPRAZOLE 1 MG PO TABS
1.0000 mg | ORAL_TABLET | Freq: Two times a day (BID) | ORAL | Status: DC
  Administered 2018-11-21 – 2018-11-22 (×2): 1 mg via ORAL
  Filled 2018-11-21 (×4): qty 1
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
## 2018-11-21 NOTE — BHH Group Notes (Signed)
LCSW Group Therapy Note   11/21/2018 1:15pm   Type of Therapy and Topic:  Group Therapy:  Overcoming Obstacles   Participation Level:  Did Not Attend--pt invited. Chose to remain in bed.    Description of Group:    In this group patients will be encouraged to explore what they see as obstacles to their own wellness and recovery. They will be guided to discuss their thoughts, feelings, and behaviors related to these obstacles. The group will process together ways to cope with barriers, with attention given to specific choices patients can make. Each patient will be challenged to identify changes they are motivated to make in order to overcome their obstacles. This group will be process-oriented, with patients participating in exploration of their own experiences as well as giving and receiving support and challenge from other group members.   Therapeutic Goals: 1. Patient will identify personal and current obstacles as they relate to admission. 2. Patient will identify barriers that currently interfere with their wellness or overcoming obstacles.  3. Patient will identify feelings, thought process and behaviors related to these barriers. 4. Patient will identify two changes they are willing to make to overcome these obstacles:      Summary of Patient Progress   x   Therapeutic Modalities:   Cognitive Behavioral Therapy Solution Focused Therapy Motivational Interviewing Relapse Prevention Therapy  Avelina Laine, [place] 11/21/2018 3:29 PM
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
## 2018-11-21 NOTE — Progress Notes (Signed)
D: Patient observed resting in bed since arrival. Patient complains of generalized chronic pain of an 8/10 and politely asks for her percocet. Patient states, "I have a lot of pain from my diagnosis. They didn't give me the percoet over at the ED so I understand if you can't here." Patient's affect anxious, nervous, mood congruent.  Denies additional physical complaints.   A: Medicated per orders, prn trazadone given for sleep. Medication education provided. Offered advil but patient refused stating, "it really doesn't work for me. Thank you, though." Level III obs in place for safety. Emotional support offered. Patient encouraged to complete Suicide Safety Plan before discharge. Encouraged to attend and participate in unit programming.   R: Patient verbalizes understanding of POC. On reassess, patient is asleep. Patient denies SI/HI/AVH and remains safe on level III obs. Will continue to monitor throughout the night.

## 2018-11-21 NOTE — Progress Notes (Signed)
D:  Patient's self inventory sheet, patient sleeps good, sleep medication helpful.  Poor appetite, low energy level, good concentration.  Rated depression 4, anxiety 7, hopelessness unsure of number.  Denied withdrawals.  Checked runny nose.  Denied SI.  Physical problems, pain, all over body, worst pain #8 in past 24 hours.  Pain medication helpful.  Goal is hopefully leave her most important goal to see her girls.  Continue keeping her mental health good, not be suicidal, depressed.  No discharge plans. A:  Medications administered per MD orders.  Emotional support and encouragement given patient. R:  Denied SI and HI, contracts for safety.  Denied A/V hallucinations.  Safety maintained with 15 minute checks.

## 2018-11-21 NOTE — Progress Notes (Signed)
Recreation Therapy Notes  Date:  2.10.20 Time: 0930 Location: 300 Hall Dayroom  Group Topic: Stress Management  Goal Area(s) Addresses:  Patient will identify positive stress management techniques. Patient will identify benefits of using stress management post d/c.  Intervention: Stress Management  Activity :  Meditation.  LRT introduced the stress management technique of meditation.  LRT played a meditation that focused on being resilient in the face of adversity.  Patients were to listen as meditation played to engage in activity.    Education:  Stress Management, Discharge Planning.   Education Outcome: Acknowledges Education  Clinical Observations/Feedback:  Pt did not attend group.    Victorino Sparrow, LRT/CTRS         Victorino Sparrow A 11/21/2018 12:18 PM

## 2018-11-21 NOTE — Tx Team (Signed)
Interdisciplinary Treatment and Diagnostic Plan Update  11/21/2018 Time of Session: 0830AM Margaret Ali MRN: [id number]  Principal Diagnosis: Bipolar Disorder  Secondary Diagnoses: Active Problems:   Bipolar affective disorder, currently manic, moderate (HCC)   Current Medications:  Current Facility-Administered Medications  Medication Dose Route Frequency Provider Last Rate Last Dose  . acetaminophen (TYLENOL) tablet 650 mg  650 mg Oral Q6H PRN Patrecia Pour, NP      . alum & mag hydroxide-simeth (MAALOX/MYLANTA) 200-200-20 MG/5ML suspension 30 mL  30 mL Oral Q4H PRN Patrecia Pour, NP      . Brexpiprazole TABS 1 mg  1 mg Oral BID Johnn Hai, MD      . citalopram (CELEXA) tablet 40 mg  40 mg Oral Daily Patrecia Pour, NP   40 mg at 11/21/18 0827  . gabapentin (NEURONTIN) capsule 300 mg  300 mg Oral BID Patrecia Pour, NP   300 mg at 11/21/18 4782  . hydrOXYzine (ATARAX/VISTARIL) tablet 25 mg  25 mg Oral Q6H PRN Lindon Romp A, NP      . ibuprofen (ADVIL,MOTRIN) tablet 600 mg  600 mg Oral Q6H PRN Lindon Romp A, NP      . lithium carbonate (LITHOBID) CR tablet 300 mg  300 mg Oral Q12H Patrecia Pour, NP   300 mg at 11/21/18 0827  . magnesium hydroxide (MILK OF MAGNESIA) suspension 30 mL  30 mL Oral Daily PRN Patrecia Pour, NP      . nicotine (NICODERM CQ - dosed in mg/24 hours) patch 21 mg  21 mg Transdermal Daily Cobos, Myer Peer, MD   21 mg at 11/21/18 0827  . traZODone (DESYREL) tablet 50 mg  50 mg Oral QHS PRN Lindon Romp A, NP   50 mg at 11/20/18 2142   PTA Medications: Medications Prior to Admission  Medication Sig Dispense Refill Last Dose  . amphetamine-dextroamphetamine (ADDERALL) 20 MG tablet Take 20 mg by mouth 2 (two) times daily.   11/19/2018 at Unknown time  . Brexpiprazole (REXULTI) 1 MG TABS Take 1 mg by mouth daily.   11/18/2018 at Unknown time  . citalopram (CELEXA) 40 MG tablet Take 40 mg by mouth daily.   11/19/2018 at Unknown time  . gabapentin  (NEURONTIN) 300 MG capsule Take 300 mg by mouth 2 (two) times daily.   11/18/2018 at Unknown time  . lithium carbonate (LITHOBID) 300 MG CR tablet Take 300 mg by mouth at bedtime.   11/18/2018 at Unknown time    Patient Stressors: Marital or family conflict Medication change or noncompliance Substance abuse  Patient Strengths: Ability for insight Average or above average intelligence Capable of independent living [employer] of knowledge Motivation for treatment/growth  Treatment Modalities: Medication Management, Group therapy, Case management,  1 to 1 session with clinician, Psychoeducation, Recreational therapy.   Physician Treatment Plan for Primary Diagnosis: Bipolar Disorder Long Term Goal(s): Improvement in symptoms so as ready for discharge Improvement in symptoms so as ready for discharge   Short Term Goals: Ability to identify and develop effective coping behaviors will improve Ability to maintain clinical measurements within normal limits will improve Compliance with prescribed medications will improve Ability to maintain clinical measurements within normal limits will improve Compliance with prescribed medications will improve Ability to identify triggers associated with substance abuse/mental health issues will improve  Medication Management: Evaluate patient's response, side effects, and tolerance of medication regimen.  Therapeutic Interventions: 1 to 1 sessions, Unit Group sessions and Medication administration.  Evaluation  of Outcomes: Progressing  Physician Treatment Plan for Secondary Diagnosis: Active Problems:   Bipolar affective disorder, currently manic, moderate ([place])  Long Term Goal(s): Improvement in symptoms so as ready for discharge Improvement in symptoms so as ready for discharge   Short Term Goals: Ability to identify and develop effective coping behaviors will improve Ability to maintain clinical measurements within normal limits will  improve Compliance with prescribed medications will improve Ability to maintain clinical measurements within normal limits will improve Compliance with prescribed medications will improve Ability to identify triggers associated with substance abuse/mental health issues will improve     Medication Management: Evaluate patient's response, side effects, and tolerance of medication regimen.  Therapeutic Interventions: 1 to 1 sessions, Unit Group sessions and Medication administration.  Evaluation of Outcomes: Progressing   RN Treatment Plan for Primary Diagnosis: Bipolar Disorder Long Term Goal(s): Knowledge of disease and therapeutic regimen to maintain health will improve  Short Term Goals: Ability to remain free from injury will improve, Ability to verbalize frustration and anger appropriately will improve, Ability to demonstrate self-control and Ability to disclose and discuss suicidal ideas  Medication Management: RN will administer medications as ordered by provider, will assess and evaluate patient's response and provide education to patient for prescribed medication. RN will report any adverse and/or side effects to prescribing provider.  Therapeutic Interventions: 1 on 1 counseling sessions, Psychoeducation, Medication administration, Evaluate responses to treatment, Monitor vital signs and CBGs as ordered, Perform/monitor CIWA, COWS, AIMS and Fall Risk screenings as ordered, Perform wound care treatments as ordered.  Evaluation of Outcomes: Progressing   LCSW Treatment Plan for Primary Diagnosis:Bipolar Disorder Long Term Goal(s): Safe transition to appropriate next level of care at discharge, Engage patient in therapeutic group addressing interpersonal concerns.  Short Term Goals: Engage patient in aftercare planning with referrals and resources, Facilitate patient progression through stages of change regarding substance use diagnoses and concerns and Identify triggers associated  with mental health/substance abuse issues  Therapeutic Interventions: Assess for all discharge needs, 1 to 1 time with Social worker, Explore available resources and support systems, Assess for adequacy in community support network, Educate family and significant other(s) on suicide prevention, Complete Psychosocial Assessment, Interpersonal group therapy.  Evaluation of Outcomes: Progressing   Progress in Treatment: Attending groups: Yes. Participating in groups: Yes. Taking medication as prescribed: Yes. Toleration medication: Yes. Family/Significant other contact made: No, will contact:  family member if pt consents to collateral contact.  Patient understands diagnosis: Yes. Discussing patient identified problems/goals with staff: Yes. Medical problems stabilized or resolved: Yes. Denies suicidal/homicidal ideation: Yes. Issues/concerns per patient self-inventory: No. Other: n/a   New problem(s) identified: No, Describe:  n/a  New Short Term/Long Term Goal(s): detox, medication management for mood stabilization; elimination of SI thoughts; development of comprehensive mental wellness/sobriety plan.   Patient Goals:  "to get out of here. I have court tomorrow."   Discharge Plan or Barriers: CSW assessing for appropriate referrals. [place] pamphlet, Mobile Crisis information, and AA/NA information provided to patient for additional community support and resources.   Reason for Continuation of Hospitalization: Anxiety Depression Mania Medication stabilization Withdrawal symptoms  Estimated Length of Stay: Tuesday, 11/22/2018  Attendees: Patient: Margaret Ali 11/21/2018 11:22 AM  Physician: Dr. Jake Samples MD 11/21/2018 11:22 AM  Nursing: Yetta Flock RN; [facility] RN 11/21/2018 11:22 AM  RN Care Manager:x 11/21/2018 11:22 AM  Social Worker: Janice Norrie LCSW 11/21/2018 11:22 AM  Recreational Therapist: x 11/21/2018 11:22 AM  Other: Harriett Sine NP 11/21/2018 11:22 AM  Other:  11/21/2018  11:22 AM  Other: 11/21/2018 11:22 AM    Scribe for Treatment Team: Avelina Laine, LCSW 11/21/2018 11:22 AM

## 2018-11-21 NOTE — Plan of Care (Signed)
Nurse discussed anxiety, depression and coping skills with patient.  
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
## 2018-11-22 MED ORDER — BREXPIPRAZOLE 1 MG PO TABS: 1.0000 mg | ORAL_TABLET | Freq: Two times a day (BID) | ORAL | 1 refills | Status: AC

## 2018-11-22 MED ORDER — CITALOPRAM HYDROBROMIDE 20 MG PO TABS: 40.0000 mg | ORAL_TABLET | Freq: Every day | ORAL | 2 refills | Status: AC

## 2018-11-22 NOTE — B[facility] Suicide Risk Assessment (Signed)
[place] INPATIENT:  Family/Significant Other Suicide Prevention Education  Suicide Prevention Education:  Patient Refusal for Family/Significant Other Suicide Prevention Education: The patient Margaret Ali has refused to provide written consent for family/significant other to be provided Family/Significant Other Suicide Prevention Education during admission and/or prior to discharge.  Physician notified.  SPE completed with pt, as pt refused to consent to family contact. SPI pamphlet provided to pt and pt was encouraged to share information with support network, ask questions, and talk about any concerns relating to SPE. Pt denies access to guns/firearms and verbalized understanding of information provided. Mobile Crisis information also provided to pt.   Avelina Laine LCSW 11/22/2018, 8:52 AM
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
## 2018-11-22 NOTE — Progress Notes (Signed)
Psychoeducational Group Note  Date:  11/22/2018 Time:  2459  Group Topic/Focus:  Wrap-Up Group:   The focus of this group is to help patients review their daily goal of treatment and discuss progress on daily workbooks.  Participation Level: Did Not Attend  Participation Quality:  Not Applicable  Affect:  Not Applicable  Cognitive:  Not Applicable  Insight:  Not Applicable  Engagement in Group: Not Applicable  Additional Comments: Patient did not attend the evening Wrap-Up Group.   Archie Balboa S 11/22/2018, 12:59 AM

## 2018-11-22 NOTE — Progress Notes (Signed)
Patient ID: Margaret Ali, female   DOB: Mar 01, 1991, 28 y.o.   MRN: [id number] DAR Note:   Pt observed in bed resting with eyes closed. Pt at assessment complained about been sad, "I miss my kids." Pt denied SI/HI, pain, depression or anxiety; "I have been in bed to feel all that." Pt was med compliant. All patient's questions and concerns addressed. Support, encouragement, and safe environment provided. Will continue to monitor for any changes. 15-minute safety checks continue.

## 2018-11-22 NOTE — Plan of Care (Signed)
Nursing discharge note: Patient discharged home per MD order.  Patient received all personal belongings from unit and locker.  Reviewed AVS/transition record with patient and she indicates understanding.  Patient will follow up with [facility] of the [place].  Patient denies any thoughts of self harm.  She left ambulatory with her mother.  Problem: Education: Goal: Knowledge of [place] [facility] Education information/materials will improve Outcome: Completed/Met Goal: Emotional status will improve Outcome: Completed/Met Goal: Mental status will improve Outcome: Completed/Met Goal: Verbalization of understanding the information provided will improve Outcome: Completed/Met   Problem: Activity: Goal: Interest or engagement in activities will improve Outcome: Completed/Met Goal: Sleeping patterns will improve Outcome: Completed/Met   Problem: Coping: Goal: Ability to verbalize frustrations and anger appropriately will improve Outcome: Completed/Met Goal: Ability to demonstrate self-control will improve Outcome: Completed/Met   Problem: Health Behavior/Discharge Planning: Goal: Identification of resources available to assist in meeting health care needs will improve Outcome: Completed/Met Goal: Compliance with treatment plan for underlying cause of condition will improve Outcome: Completed/Met   Problem: Physical Regulation: Goal: Ability to maintain clinical measurements within normal limits will improve Outcome: Completed/Met   Problem: Safety: Goal: Periods of time without injury will increase Outcome: Completed/Met   Problem: Education: Goal: Ability to make informed decisions regarding treatment will improve Outcome: Completed/Met   Problem: Coping: Goal: Coping ability will improve Outcome: Completed/Met   Problem: Health Behavior/Discharge Planning: Goal: Identification of resources available to assist in meeting health care needs will improve Outcome:  Completed/Met   Problem: Medication: Goal: Compliance with prescribed medication regimen will improve Outcome: Completed/Met   Problem: Self-Concept: Goal: Ability to disclose and discuss suicidal ideas will improve Outcome: Completed/Met Goal: Will verbalize positive feelings about self Outcome: Completed/Met   Problem: Education: Goal: Utilization of techniques to improve thought processes will improve Outcome: Completed/Met Goal: Knowledge of the prescribed therapeutic regimen will improve Outcome: Completed/Met   Problem: Activity: Goal: Interest or engagement in leisure activities will improve Outcome: Completed/Met Goal: Imbalance in normal sleep/wake cycle will improve Outcome: Completed/Met   Problem: Coping: Goal: Coping ability will improve Outcome: Completed/Met Goal: Will verbalize feelings Outcome: Completed/Met   Problem: Health Behavior/Discharge Planning: Goal: Ability to make decisions will improve Outcome: Completed/Met Goal: Compliance with therapeutic regimen will improve Outcome: Completed/Met   Problem: Role Relationship: Goal: Will demonstrate positive changes in social behaviors and relationships Outcome: Completed/Met   Problem: Safety: Goal: Ability to disclose and discuss suicidal ideas will improve Outcome: Completed/Met Goal: Ability to identify and utilize support systems that promote safety will improve Outcome: Completed/Met   Problem: Self-Concept: Goal: Will verbalize positive feelings about self Outcome: Completed/Met Goal: Level of anxiety will decrease Outcome: Completed/Met   Problem: Education: Goal: Knowledge of General Education information will improve Description Including pain rating scale, medication(s)/side effects and non-pharmacologic comfort measures Outcome: Completed/Met   Problem: Health Behavior/Discharge Planning: Goal: Ability to manage health-related needs will improve Outcome: Completed/Met    Problem: Coping: Goal: Level of anxiety will decrease Outcome: Completed/Met   Problem: Safety: Goal: Ability to remain free from injury will improve Outcome: Completed/Met

## 2018-11-22 NOTE — Progress Notes (Signed)
  [facility] Adult Case Management Discharge Plan :  Will you be returning to the same living situation after discharge:  Yes,  home At discharge, do you have transportation home?: Yes,  family member coming at [place] you have the ability to pay for your medications: Yes,  medicaid  Release of information consent forms completed and submitted to medical records by CSW.  Patient to Follow up at: Follow-up Information    [facility] Follow up.   Specialty:  Professional Counselor Why:  Please follow up for services during walk-in hours, Monday-Friday 8:00a.-12:00p and 1:30p-3:00p. Please bring your photo ID, proof of insurance, SSN, current medications and discharge paperwork from this hospitalization.  Contact information: [facility] [place] [address] [phone]           Next level of care provider has access to [place] and Suicide Prevention discussed: Yes,  SPE completed with pt; pt declined to consent to collateral contact.   Have you used any form of tobacco in the last 30 days? (Cigarettes, Smokeless Tobacco, Cigars, and/or Pipes): Yes  Has patient been referred to the Quitline?: Patient refused referral  Patient has been referred for addiction treatment: Yes  Avelina Laine, LCSW 11/22/2018, 8:52 AM

## 2024-06-30 ENCOUNTER — Other Ambulatory Visit: Payer: Self-pay | Admitting: Medical Genetics

## 2024-07-20 ENCOUNTER — Other Ambulatory Visit: Payer: MEDICAID
